# Patient Record
Sex: Female | Born: 1960 | Race: White | Hispanic: No | Smoking: Current every day smoker
Health system: Southern US, Community
[De-identification: ages and names within clinical notes are randomized; demographics above are authoritative.]

## PROBLEM LIST (undated history)

## (undated) DIAGNOSIS — I219 Acute myocardial infarction, unspecified: Secondary | ICD-10-CM

## (undated) DIAGNOSIS — I10 Essential (primary) hypertension: Secondary | ICD-10-CM

## (undated) HISTORY — PX: TUBAL LIGATION: SHX77

---

## 2005-02-09 ENCOUNTER — Emergency Department: Payer: Self-pay | Admitting: Emergency Medicine

## 2005-02-09 ENCOUNTER — Other Ambulatory Visit: Payer: Self-pay

## 2006-03-31 ENCOUNTER — Emergency Department: Payer: Self-pay | Admitting: Internal Medicine

## 2006-05-23 ENCOUNTER — Emergency Department: Payer: Self-pay | Admitting: Emergency Medicine

## 2008-04-30 ENCOUNTER — Other Ambulatory Visit: Payer: Self-pay

## 2008-05-01 ENCOUNTER — Inpatient Hospital Stay: Payer: Self-pay | Admitting: Internal Medicine

## 2009-09-19 ENCOUNTER — Emergency Department: Payer: Self-pay | Admitting: Emergency Medicine

## 2010-05-21 ENCOUNTER — Emergency Department: Payer: Self-pay | Admitting: Emergency Medicine

## 2015-01-07 ENCOUNTER — Other Ambulatory Visit: Payer: Self-pay

## 2015-01-07 ENCOUNTER — Emergency Department: Payer: BLUE CROSS/BLUE SHIELD

## 2015-01-07 ENCOUNTER — Encounter: Payer: Self-pay | Admitting: *Deleted

## 2015-01-07 ENCOUNTER — Emergency Department
Admission: EM | Admit: 2015-01-07 | Discharge: 2015-01-07 | Disposition: A | Discharge status: Home / Self Care | Payer: BLUE CROSS/BLUE SHIELD | Attending: Emergency Medicine | Admitting: Emergency Medicine

## 2015-01-07 DIAGNOSIS — R0602 Shortness of breath: Secondary | ICD-10-CM | POA: Diagnosis not present

## 2015-01-07 DIAGNOSIS — Z720 Tobacco use: Secondary | ICD-10-CM | POA: Insufficient documentation

## 2015-01-07 DIAGNOSIS — R202 Paresthesia of skin: Secondary | ICD-10-CM | POA: Diagnosis not present

## 2015-01-07 DIAGNOSIS — R200 Anesthesia of skin: Secondary | ICD-10-CM | POA: Diagnosis present

## 2015-01-07 DIAGNOSIS — R05 Cough: Secondary | ICD-10-CM | POA: Diagnosis not present

## 2015-01-07 DIAGNOSIS — R079 Chest pain, unspecified: Secondary | ICD-10-CM | POA: Insufficient documentation

## 2015-01-07 LAB — COMPREHENSIVE METABOLIC PANEL
ALBUMIN: 4.4 g/dL (ref 3.5–5.0)
ALT: 17 U/L (ref 14–54)
AST: 19 U/L (ref 15–41)
Alkaline Phosphatase: 62 U/L (ref 38–126)
Anion gap: 8 (ref 5–15)
BILIRUBIN TOTAL: 0.6 mg/dL (ref 0.3–1.2)
BUN: 21 mg/dL — AB (ref 6–20)
CALCIUM: 9.5 mg/dL (ref 8.9–10.3)
CHLORIDE: 103 mmol/L (ref 101–111)
CO2: 26 mmol/L (ref 22–32)
Creatinine, Ser: 0.92 mg/dL (ref 0.44–1.00)
GFR calc Af Amer: >60 mL/min (ref >60)
GFR calc non Af Amer: >60 mL/min (ref >60)
GLUCOSE: 108 mg/dL — AB (ref 65–99)
Potassium: 3.7 mmol/L (ref 3.5–5.1)
Sodium: 137 mmol/L (ref 135–145)
Total Protein: 7.8 g/dL (ref 6.5–8.1)

## 2015-01-07 LAB — CBC
HCT: 41.8 % (ref 35.0–47.0)
Hemoglobin: 14.1 g/dL (ref 12.0–16.0)
MCH: 30.4 pg (ref 26.0–34.0)
MCHC: 33.7 g/dL (ref 32.0–36.0)
MCV: 90 fL (ref 80.0–100.0)
Platelets: 186 10*3/uL (ref 150–440)
RBC: 4.64 MIL/uL (ref 3.80–5.20)
RDW: 13.9 % (ref 11.5–14.5)
WBC: 10 10*3/uL (ref 3.6–11.0)

## 2015-01-07 LAB — TROPONIN I: Troponin I: <0.03 ng/mL (ref <0.031)

## 2015-01-07 NOTE — Discharge Instructions (Signed)
You were evaluated for right arm numbness and tingling for which no certain cause was found, however I suspect is due to a pinched nerve or after sleeping. We also discussed your symptoms of intermittent chest pain, cough, and shortness of breath for which no exact cause was found either however your exam and evaluation are reassuring. You need to follow with primary care doctor and her clinic was referred to you to call for appointment next week. Return to the emergency department for any new or worsening condition including new weakness, numbness, chest pain, trouble breathing, shortness breath, or any altered mental status or any other symptoms concerning to you. Paresthesia Paresthesia is a burning or prickling feeling. This feeling can happen in any part of the body. It often happens in the hands, arms, legs, or feet. HOME CARE  Avoid drinking alcohol.  Try massage or needle therapy (acupuncture) to help with your problems.  Keep all doctor visits as told. GET HELP RIGHT AWAY IF:   You feel weak.  You have trouble walking or moving.  You have problems speaking or seeing.  You feel confused.  You cannot control when you poop (bowel movement) or pee (urinate).  You lose feeling (numbness) after an injury.  You pass out (faint).  Your burning or prickling feeling gets worse when you walk.  You have pain, cramps, or feel dizzy.  You have a rash. MAKE SURE YOU:   Understand these instructions.  Will watch your condition.  Will get help right away if you are not doing well or get worse. Document Released: 07/05/2008 Document Revised: 10/15/2011 Document Reviewed: 04/13/2011 [facility] Patient Information 2015 [employer] [place]. This information is not intended to replace advice given to you by your health care provider. Make sure you discuss any questions you have with your health care provider.

## 2015-01-07 NOTE — ED Notes (Signed)
Patient in CT

## 2015-01-07 NOTE — ED Provider Notes (Signed)
[facility] Emergency Department Provider Note   ____________________________________________  Time seen: Noon I have reviewed the triage vital signs and the triage nursing note.  HISTORY  Chief Complaint Shortness of Breath   Historian  Patient  HPI Morgan Suarez is a 54 y.o. female who came in for evaluation for an episode of right arm numbness and tingling yesterday. It happened after she woke up and she thinks that she slept on it wrong. This is not happened to her before. She had no headache, and no neck pain. She had no weakness. Patient had complained of shortness of breath for roughly 2 days with a cough and occasional chest pains and she does think that this is related to a panic attack. No history of coronary artery disease, however the patient is a smoker. She works at [employer] and walks approximately 11 miles per night.   History reviewed. No pertinent past medical history.  There are no active problems to display for this patient.   Past Surgical History  Procedure Laterality Date  . Tubal ligation      No current outpatient prescriptions on file.  Allergies Demerol  No family history on file.  Social History History  Substance Use Topics  . Smoking status: Current Every Day Smoker  . Smokeless tobacco: Not on file  . Alcohol Use: Yes    Review of Systems  Constitutional: Negative for fever. Eyes: Negative for visual changes. ENT: Negative for sore throat. Cardiovascular: Positive for chest pain, central and last episode was 2 nights ago and lasted only a few minutes. Respiratory: Positive for shortness of breath with occasional cough over the past 2 days. Gastrointestinal: Negative for abdominal pain, vomiting and diarrhea. Genitourinary: Negative for dysuria. Musculoskeletal: Negative for back pain. Skin: Negative for rash. Neurological: Negative for headaches, focal weakness or  numbness.  ____________________________________________   PHYSICAL EXAM:  VITAL SIGNS: ED Triage Vitals  Enc Vitals Group     BP 01/07/15 0719 148/7 mmHg     Pulse Rate 01/07/15 0719 82     Resp 01/07/15 0719 20     Temp 01/07/15 0719 98 F (36.7 C)     Temp src --      SpO2 01/07/15 0719 100 %     Weight 01/07/15 0718 160 lb (72.576 kg)     Height 01/07/15 0718  (1.651 m)     Head Cir --      Peak Flow --      Pain Score 01/07/15 0741 0     Pain Loc --      Pain Edu? --      Excl. in GC? --      Constitutional: Alert and oriented. Well appearing and in no distress. Eyes: Conjunctivae are normal. PERRL. Normal extraocular movements. ENT   Head: Normocephalic and atraumatic.   Nose: No congestion/rhinnorhea.   Mouth/Throat: Mucous membranes are moist.   Neck: No stridor. Cardiovascular: Normal rate, regular rhythm.  No murmurs, rubs, or gallops. Respiratory: Normal respiratory effort without tachypnea nor retractions. Breath sounds are clear and equal bilaterally. No wheezes/rales/rhonchi. Gastrointestinal: Soft and nontender. No distention.  Genitourinary: Deferred Musculoskeletal: Nontender with normal range of motion in all extremities. No joint effusions.  No lower extremity tenderness nor edema. Neurologic:  Normal speech and language. No gross focal neurologic deficits are appreciated. 5 out of 5 strength in 4 shortness. No cerebellar abnormalities appreciated. Skin:  Skin is warm, dry and intact. No rash noted. Psychiatric:  Mood and affect are normal. Speech and behavior are normal. Patient exhibits appropriate insight and judgment.  ____________________________________________   EKG  I, Governor Rooksebecca Teniqua Marron, MD, the attending physician have personally viewed and interpreted this ECG.   80 bpm. Sinus rhythm. Normal axis. Narrow QRS. Normal ST and T-wave. ____________________________________________  LABS (pertinent positives/negatives)  Metabolic  panel within normal limits Troponin less than 0.03 CBC within normal limits  ____________________________________________  RADIOLOGY Radiologist results reviewed  Chest x-ray: Negative CT head: No acute findings, normal head CT __________________________________________  PROCEDURES  Procedure(s) performed: None Critical Care performed: None  ____________________________________________   ED COURSE / ASSESSMENT AND PLAN  Pertinent labs & imaging results that were available during my care of the patient were reviewed by me and considered in my medical decision making (see chart for details).  Although the triage note indicates shortness breath and chest pain is that patient's chief complaint, when I talked the patient's she is more worried about this episode of right arm numbness and tingling which was then accompanied by an episode of chest pain and shortness of breath which the patient ascribes to a panic attack. I do suspect the history that she probably slept or leg on that arm wrong and had tingling due to peripheral nerve compression which is now better. However I did discuss with her doing a CT scan to evaluate for signs of obvious stroke and I would expect to see evidence if there is a patient's symptoms were greater than 24 hours ago.  In terms of the complaint of chest pain cough and short of breath, her evaluation is reassuring. I do not suspect ACS. I do not suspect a PE.   CT is negative. I discussed with patient her reassuring exam, evaluation. We discussed return depressions and discharge instructions the patient is comfortable with this plan.   ___________________________________________   FINAL CLINICAL IMPRESSION(S) / ED DIAGNOSES   Final diagnoses:  Numbness and tingling of right arm      Governor Rooksebecca Milaina Sher, MD 01/07/15 1351

## 2015-01-07 NOTE — ED Provider Notes (Deleted)
Medical screening examination/treatment/procedure(s) were performed by non-physician practitioner and as supervising physician I was immediately available for consultation/collaboration.    Governor Rooksebecca Hasson Gaspard, MD 01/07/15 1250

## 2016-06-15 ENCOUNTER — Encounter: Payer: Self-pay | Admitting: Emergency Medicine

## 2016-06-15 ENCOUNTER — Emergency Department
Admission: EM | Admit: 2016-06-15 | Discharge: 2016-06-15 | Disposition: A | Discharge status: Home / Self Care | Payer: BLUE CROSS/BLUE SHIELD | Attending: Emergency Medicine | Admitting: Emergency Medicine

## 2016-06-15 DIAGNOSIS — F17200 Nicotine dependence, unspecified, uncomplicated: Secondary | ICD-10-CM | POA: Insufficient documentation

## 2016-06-15 DIAGNOSIS — J3489 Other specified disorders of nose and nasal sinuses: Secondary | ICD-10-CM | POA: Insufficient documentation

## 2016-06-15 HISTORY — DX: Acute myocardial infarction, unspecified: I21.9

## 2016-06-15 NOTE — ED Notes (Addendum)
Pt reports she takes Plavix

## 2016-06-15 NOTE — ED Notes (Signed)
Reviewed d/c instructions, follow-up care. Pt verbalized understanding 

## 2016-06-15 NOTE — ED Notes (Signed)
MD Stafford at bedside. 

## 2016-06-15 NOTE — ED Triage Notes (Signed)
Patient ambulatory to triage with steady gait, without difficulty or distress noted; pt reports awoke with blood in mouth; taking blood thinners; denies any injury, denies any bleeding at present

## 2016-06-15 NOTE — ED Notes (Signed)
Pt reports she awoke at approx 0400 this morning with small amount of blood in mouth. Pt denies currently bleeding. Pt denies any pain.

## 2016-06-15 NOTE — ED Provider Notes (Signed)
[facility] Provider Note  ____________________________________________  Time seen: Approximately 4:51 AM  I have reviewed the triage vital signs and the nursing notes.   HISTORY  Chief Complaint Bloody sputum   HPI Wayland SalinasCheryl S Cosby is a 55 y.o. female reports that she awoke this morning about an hour ago and noticed that she had blood in her mouth that she spit out. She reports a "handful of bloody sputum", afterward there was no bleeding. She denies any wounds or injuries. No history of peptic ulcer disease or esophageal varices. Doesn't take blood thinners. No nosebleed. No cough.     Past Medical History:  Diagnosis Date  . MI (myocardial infarction)      There are no active problems to display for this patient.    Past Surgical History:  Procedure Laterality Date  . TUBAL LIGATION       Prior to Admission medications   Not on File     Allergies Demerol [meperidine]   History reviewed. No pertinent family history.  Social History Social History  Substance Use Topics  . Smoking status: Current Every Day Smoker  . Smokeless tobacco: Never Used  . Alcohol use Yes    Review of Systems  Constitutional:   No fever or chills.  ENT:   No sore throat. No rhinorrhea. Cardiovascular:   No chest pain. Respiratory:   No dyspnea or cough. Gastrointestinal:   Negative for abdominal pain, vomiting and diarrhea.   10-point ROS otherwise negative.  ____________________________________________   PHYSICAL EXAM:  VITAL SIGNS: ED Triage Vitals [06/15/16 0435]  Enc Vitals Group     BP (!) 188/100     Pulse Rate 94     Resp 18     Temp 98.2 F (36.8 C)     Temp Source Oral     SpO2 98 %     Weight 160 lb (72.6 kg)     Height 5\' 5"  (1.651 m)     Head Circumference      Peak Flow      Pain Score      Pain Loc      Pain Edu?      Excl. in GC?     Vital signs reviewed, nursing assessments  reviewed.   Constitutional:   Alert and oriented. Well appearing and in no distress. Eyes:   No scleral icterus. No conjunctival pallor. PERRL. EOMI.  No nystagmus. ENT   Head:   Normocephalic and atraumatic.   Nose:   No congestion/rhinnorhea. No septal hematoma. Positive dryness in the nasal passages   Mouth/Throat:   MMM, no pharyngeal erythema. No peritonsillar mass. Poor dentition. Gingivitis. Careful examination of the oral cavity gums and buccal mucosa does not show any wounds or active or recent bleeding. No tongue injury.   Neck:   No stridor. No SubQ emphysema. No meningismus. Hematological/Lymphatic/Immunilogical:   No cervical lymphadenopathy. Cardiovascular:   RRR. Symmetric bilateral radial and DP pulses.  No murmurs.  Respiratory:   Normal respiratory effort without tachypnea nor retractions. Breath sounds are clear and equal bilaterally. No wheezes/rales/rhonchi. Gastrointestinal:   Soft and nontender. Non distended. There is no CVA tenderness.  No rebound, rigidity, or guarding. Genitourinary:   deferred  Neurologic:   Normal speech and language.  CN 2-10 normal. Motor grossly intact. No gross focal neurologic deficits are appreciated.   ____________________________________________    LABS (pertinent positives/negatives) (all labs ordered are listed, but only abnormal results are displayed) Labs Reviewed -  No data to display ____________________________________________   EKG    ____________________________________________    RADIOLOGY    ____________________________________________   PROCEDURES Procedures  ____________________________________________   INITIAL IMPRESSION / ASSESSMENT AND PLAN / ED COURSE  Pertinent labs & imaging results that were available during my care of the patient were reviewed by me and considered in my medical decision making (see chart for details).  Patient well appearing no acute distress. Complains of  bloody sputum, but on exam everything is entirely normal. No evidence of any wounds. No symptoms to suggest hemoptysis or hematemesis. Low suspicion of GI bleed or pulmonary pathology. Advised patient to continue monitoring her symptoms, return to ED or follow up with primary care as needed. Counseled on humidifier in the home to prevent epistaxis.     Clinical Course    ____________________________________________   FINAL CLINICAL IMPRESSION(S) / ED DIAGNOSES  Final diagnoses:  Nasal dryness       Portions of this note were generated with dragon dictation software. Dictation errors may occur despite best attempts at proofreading.    Sharman CheekPhillip Ankush Gintz, MD 06/15/16 [phone]

## 2018-03-29 ENCOUNTER — Encounter: Payer: Self-pay | Admitting: Emergency Medicine

## 2018-03-29 ENCOUNTER — Emergency Department
Admission: EM | Admit: 2018-03-29 | Discharge: 2018-03-29 | Disposition: A | Discharge status: Home / Self Care | Payer: No Typology Code available for payment source | Attending: Emergency Medicine | Admitting: Emergency Medicine

## 2018-03-29 ENCOUNTER — Other Ambulatory Visit: Payer: Self-pay

## 2018-03-29 ENCOUNTER — Emergency Department: Payer: No Typology Code available for payment source

## 2018-03-29 DIAGNOSIS — F17210 Nicotine dependence, cigarettes, uncomplicated: Secondary | ICD-10-CM | POA: Insufficient documentation

## 2018-03-29 DIAGNOSIS — Z7902 Long term (current) use of antithrombotics/antiplatelets: Secondary | ICD-10-CM | POA: Insufficient documentation

## 2018-03-29 DIAGNOSIS — R079 Chest pain, unspecified: Secondary | ICD-10-CM | POA: Diagnosis present

## 2018-03-29 DIAGNOSIS — R0789 Other chest pain: Secondary | ICD-10-CM | POA: Diagnosis not present

## 2018-03-29 DIAGNOSIS — Z79899 Other long term (current) drug therapy: Secondary | ICD-10-CM | POA: Diagnosis not present

## 2018-03-29 DIAGNOSIS — Z7982 Long term (current) use of aspirin: Secondary | ICD-10-CM | POA: Insufficient documentation

## 2018-03-29 DIAGNOSIS — M791 Myalgia, unspecified site: Secondary | ICD-10-CM | POA: Insufficient documentation

## 2018-03-29 DIAGNOSIS — M7918 Myalgia, other site: Secondary | ICD-10-CM

## 2018-03-29 MED ORDER — CYCLOBENZAPRINE HCL 5 MG PO TABS: 5.0000 mg | ORAL_TABLET | Freq: Three times a day (TID) | ORAL | 0 refills | Status: DC | PRN

## 2018-03-29 NOTE — ED Notes (Signed)
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
## 2018-03-29 NOTE — Discharge Instructions (Addendum)
Your exam and x-rays are negative for any fracture or dislocation. Take the prescription muscle relaxant as needed. Apply ice or moist heat to reduce pain. [facility] or return as needed.
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
## 2019-06-26 ENCOUNTER — Other Ambulatory Visit: Payer: Self-pay | Admitting: Pediatrics

## 2019-06-26 ENCOUNTER — Ambulatory Visit
Admission: RE | Admit: 2019-06-26 | Discharge: 2019-06-26 | Disposition: A | Discharge status: Home / Self Care | Payer: Disability Insurance | Source: Ambulatory Visit | Attending: Pediatrics | Admitting: Pediatrics

## 2019-06-26 DIAGNOSIS — M5137 Other intervertebral disc degeneration, lumbosacral region: Secondary | ICD-10-CM

## 2020-04-19 IMAGING — CR DG LUMBAR SPINE 2-3V
1 series · 3 of 3 positions shown · non-contrast
Comparison: None.

CLINICAL DATA: Low back pain worsening recently. Radiates down
right leg.

EXAM:
LUMBAR SPINE - 2-3 VIEW

[Series 1: dg lumbar spine 2-3 views · 0.14mm/px · 3 of 3 slices shown]
[im 1/3]
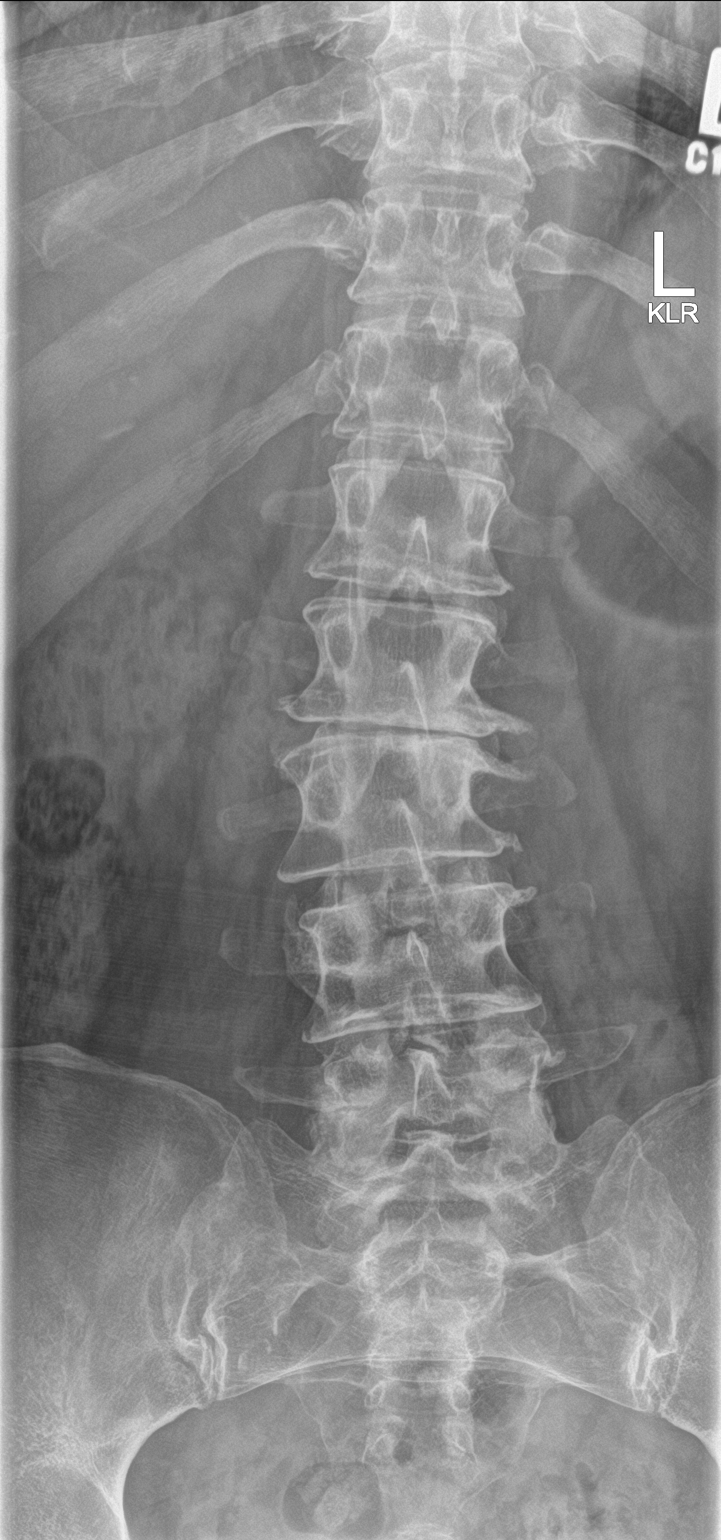
[im 2/3]
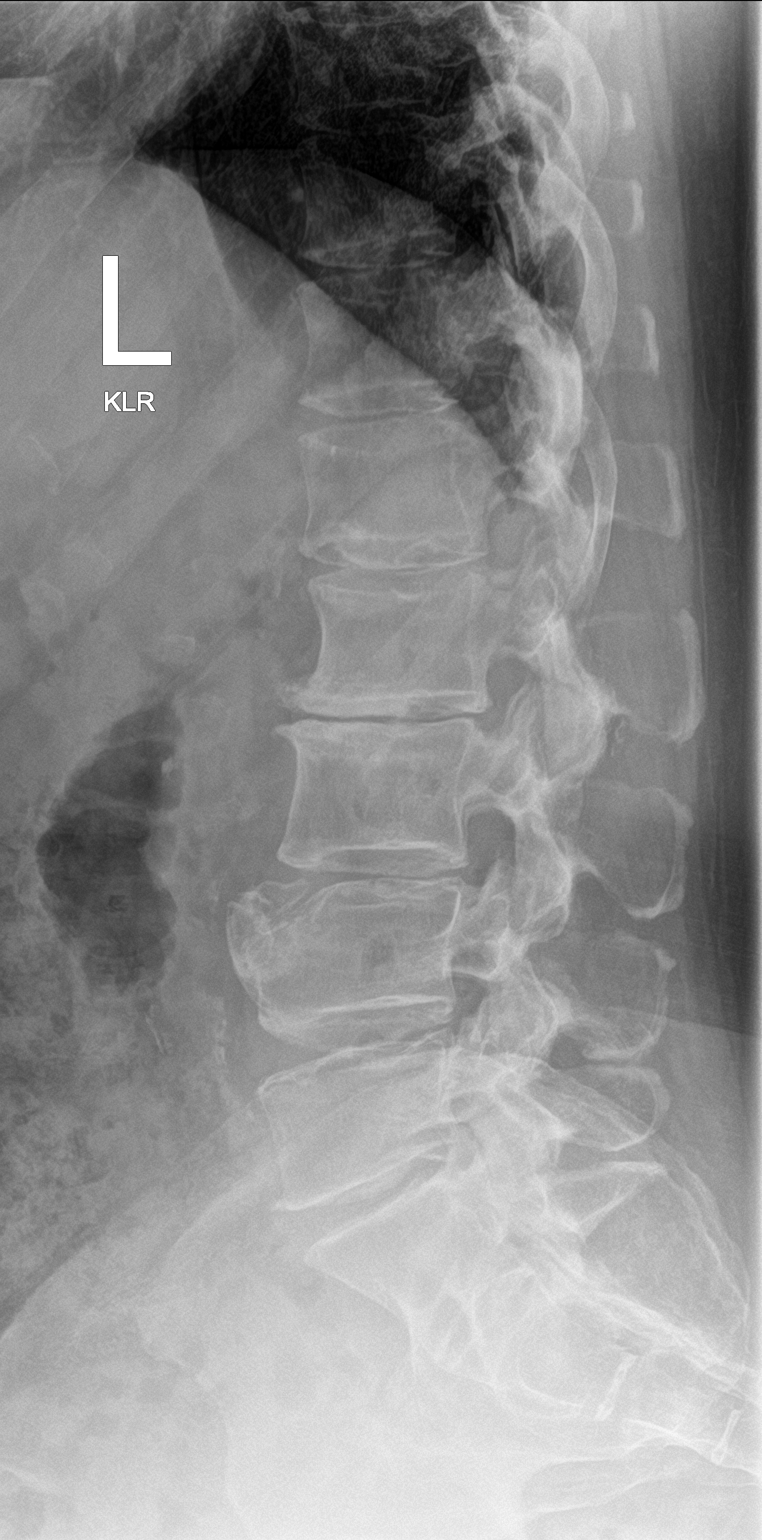
[im 3/3]
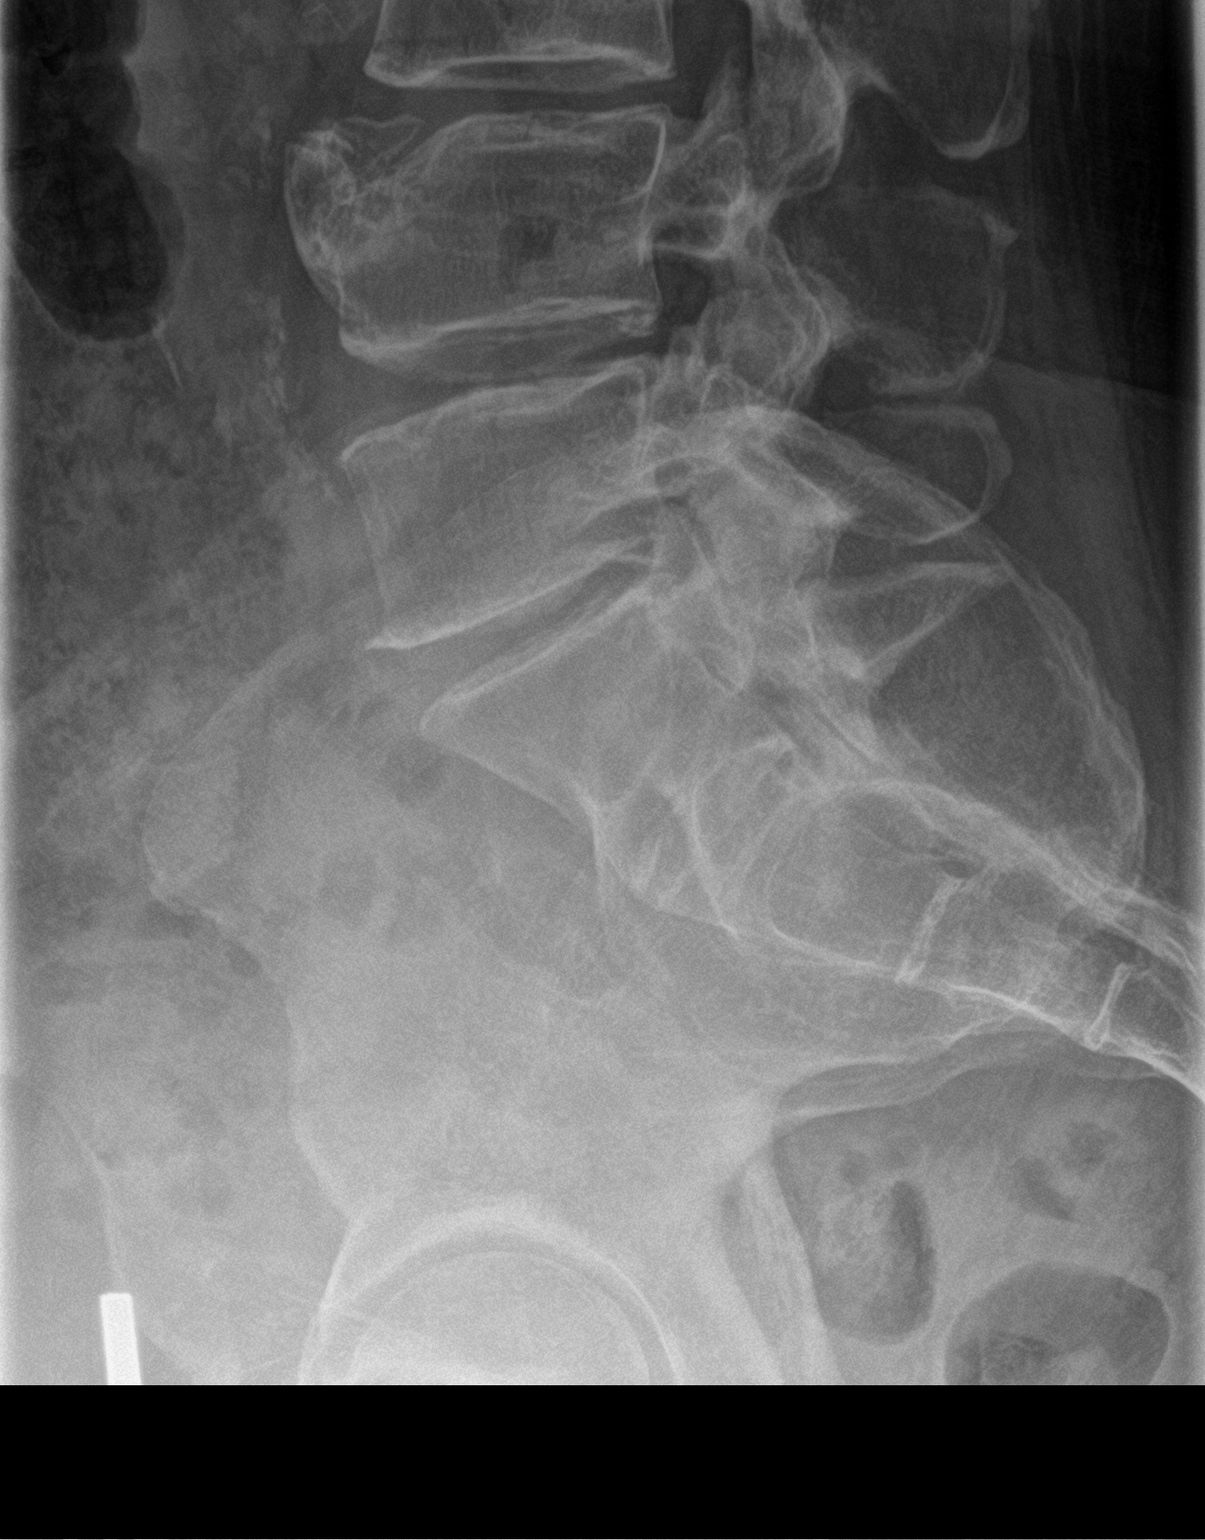

[3 of 3 positions shown; findings below may reference images not displayed]

FINDINGS: Limbus deformity noted L4 with degenerative change at the
anterosuperior fragment. Loss of disc height with endplate
degeneration noted L1-2 and L2-3. SI joints are unremarkable.
IMPRESSION: No acute bony abnormality.

Degenerative disc disease in the mid lumbar spine.

## 2023-04-10 NOTE — Congregational Nurse Program (Signed)
  Dept: [phone]   Congregational Nurse Program Note  Date of Encounter: 04/10/2023 [facility] requesting BP check as she has been feeling extra worn out and dragging today. 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 Taking meds as prescribed. C/o light headedness upon standing at times. PCP [facility] health care. Has not been seen recently. BP 102/64 P 68 reg. Discussed importance of following up with PCP asap, staying hydrated, standing slowly to prevent falls. Client verbalizes understanding. HIPAA discussed. Follow up in clinic as needed. Rhermann, RN Past Medical History: Past Medical History:  Diagnosis Date   MI (myocardial infarction) [facility])     Encounter Details:  CNP Questionnaire - 04/10/23 1400       Questionnaire   Ask client: Do you give verbal consent for me to treat you today? Yes    Student Assistance N/A    Location Patient Information systems manager, [employer]    Visit Setting with Client Organization    Patient Status Unknown    Insurance Medicaid    Insurance/Financial Assistance Referral N/A    Medication N/A    Medical Provider Yes    Screening Referrals Made N/A    Medical Referrals Made Non-[facility] PCP/Clinic    Medical Appointment Made N/A    Recently w/o PCP, now 1st time PCP visit completed due to CNs referral or appointment made N/A    Food Have Food Insecurities    Transportation N/A    Housing/Utilities N/A    Interpersonal Safety N/A    Interventions Educate    Abnormal to Normal Screening Since Last CN Visit N/A    Screenings CN Performed Blood Pressure    Sent Client to Lab for: N/A    Did client attend any of the following based off CNs referral or appointments made? N/A    ED Visit Averted N/A    Life-Saving Intervention Made N/A

## 2023-10-15 ENCOUNTER — Ambulatory Visit

## 2023-10-15 ENCOUNTER — Telehealth: Payer: Self-pay

## 2023-10-15 NOTE — Telephone Encounter (Signed)
 Phone not in service.

## 2023-12-16 ENCOUNTER — Emergency Department: Admission: EM | Admit: 2023-12-16 | Discharge: 2023-12-16 | Disposition: A | Discharge status: Home / Self Care

## 2023-12-16 ENCOUNTER — Encounter: Payer: Self-pay | Admitting: Emergency Medicine

## 2023-12-16 ENCOUNTER — Other Ambulatory Visit: Payer: Self-pay

## 2023-12-16 ENCOUNTER — Emergency Department

## 2023-12-16 DIAGNOSIS — M545 Low back pain, unspecified: Secondary | ICD-10-CM | POA: Diagnosis not present

## 2023-12-16 DIAGNOSIS — Y92410 Unspecified street and highway as the place of occurrence of the external cause: Secondary | ICD-10-CM | POA: Insufficient documentation

## 2023-12-16 DIAGNOSIS — R519 Headache, unspecified: Secondary | ICD-10-CM | POA: Insufficient documentation

## 2023-12-16 DIAGNOSIS — M25511 Pain in right shoulder: Secondary | ICD-10-CM | POA: Diagnosis present

## 2023-12-16 MED ORDER — IBUPROFEN 800 MG PO TABS
800.0000 mg | ORAL_TABLET | Freq: Once | ORAL | Status: AC
  Administered 2023-12-16: 800 mg via ORAL
  Filled 2023-12-16: qty 1
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
## 2023-12-16 NOTE — Discharge Instructions (Signed)
 You can take 650 mg of Tylenol and 600 mg of ibuprofen every 6 hours as needed for pain. You can use ice, heat, muscle creams and other topical pain relievers as well.  You will likely be more sore tomorrow that you are today, this is normal. After this your pain should slowly be improving. If not, you need to be seen by another health care provider. This could be your PCP, urgent care or in the emergency department. Return to the emergency department specifically, if you have worsening chest pain, shortness of breath, abdominal pain, new abdominal bruising or any other symptom personally concerning to you.
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
## 2024-04-09 ENCOUNTER — Ambulatory Visit
Admission: EM | Admit: 2024-04-09 | Discharge: 2024-04-09 | Disposition: A | Discharge status: Home / Self Care | Attending: Physician Assistant | Admitting: Physician Assistant

## 2024-04-09 DIAGNOSIS — R102 Pelvic and perineal pain: Secondary | ICD-10-CM | POA: Diagnosis present

## 2024-04-09 DIAGNOSIS — N390 Urinary tract infection, site not specified: Secondary | ICD-10-CM | POA: Diagnosis present

## 2024-04-09 DIAGNOSIS — R300 Dysuria: Secondary | ICD-10-CM | POA: Diagnosis present

## 2024-04-09 LAB — URINALYSIS, W/ REFLEX TO CULTURE (INFECTION SUSPECTED)
Bilirubin Urine: NEGATIVE
Glucose, UA: NEGATIVE mg/dL
Ketones, ur: NEGATIVE mg/dL
Nitrite: POSITIVE — AB
Protein, ur: NEGATIVE mg/dL
Specific Gravity, Urine: >1.03 — ABNORMAL HIGH (ref 1.005–1.030)
pH: 5 (ref 5.0–8.0)

## 2024-04-09 MED ORDER — CEPHALEXIN 500 MG PO CAPS: 500.0000 mg | ORAL_CAPSULE | Freq: Four times a day (QID) | ORAL | 0 refills | Status: DC

## 2024-04-09 NOTE — ED Triage Notes (Signed)
 Pt c/o Possible UTI x2weeks  Pt states that she has urinary odor, burning, and right flank pain

## 2024-04-09 NOTE — ED Provider Notes (Signed)
 [facility]    CSN: [id number] Arrival date & time: 04/09/24  1609      History   Chief Complaint Chief Complaint  Patient presents with   Urinary Tract Infection    HPI Morgan Suarez is a 63 y.o. female.   Patient complains of burning with urination.  Patient reports that she has had a odor to her urine for the past 2 weeks.  Patient reports that she has experienced chills she has not had any fever.  Patient denies any abdominal pain she is not having any back pain.  Patient denies any nausea vomiting or diarrhea.  Patient has past medical history of coronary artery disease.  Patient reports she is allergic to Demerol.  The history is provided by the patient.  Urinary Tract Infection   Past Medical History:  Diagnosis Date   MI (myocardial infarction) (HCC)     There are no active problems to display for this patient.   Past Surgical History:  Procedure Laterality Date   TUBAL LIGATION      OB History   No obstetric history on file.      Home Medications    Prior to Admission medications   Medication Sig Start Date End Date Taking? Authorizing Provider  acetaminophen (TYLENOL) 500 MG tablet Take 1,000 mg by mouth. 04/27/22  Yes [provider]  aspirin EC 81 MG tablet Take 81 mg by mouth daily.   Yes [provider]  atorvastatin (LIPITOR) 20 MG tablet Take 20 mg by mouth daily.   Yes [provider]  cephALEXin  (KEFLEX ) 500 MG capsule Take 1 capsule (500 mg total) by mouth 4 (four) times daily. 04/09/24  Yes Shamarion Coots K, PA-C  clopidogrel (PLAVIX) 75 MG tablet Take 75 mg by mouth daily.   Yes [provider]  cyclobenzaprine  (FLEXERIL ) 5 MG tablet Take 1 tablet (5 mg total) by mouth 3 (three) times daily as needed for muscle spasms. 03/29/18  Yes Menshew, Candida LULLA Kings, PA-C  diclofenac Sodium (VOLTAREN) 1 % GEL Apply 2 g topically. 10/17/23 10/16/24 Yes [provider]  gabapentin (NEURONTIN) 300 MG  capsule Take 900 mg by mouth. 10/17/23 05/07/24 Yes [provider]  lidocaine (LIDODERM) 5 % Place 1 patch onto the skin. 10/17/23 10/16/24 Yes [provider]  metoprolol succinate (TOPROL-XL) 25 MG 24 hr tablet Take 25 mg by mouth daily.   Yes [provider]  traZODone (DESYREL) 50 MG tablet Take 75 mg by mouth. 10/17/23 05/31/24 Yes [provider]  VENTOLIN HFA 108 (90 Base) MCG/ACT inhaler Inhale into the lungs.   Yes [provider]    Family History History reviewed. No pertinent family history.  Social History Social History   Tobacco Use   Smoking status: Every Day    Current packs/day: 1.00    Types: Cigarettes   Smokeless tobacco: Never  Vaping Use   Vaping status: Every Day  Substance Use Topics   Alcohol use: Yes    Comment: rarely   Drug use: Never     Allergies   Demerol [meperidine]   Review of Systems Review of Systems  All other systems reviewed and are negative.    Physical Exam Triage Vital Signs ED Triage Vitals [04/09/24 1639]  Encounter Vitals Group     BP 117/82     Girls Systolic BP Percentile      Girls Diastolic BP Percentile      Boys Systolic BP Percentile  Boys Diastolic BP Percentile      Pulse Rate 78     Resp 16     Temp 98.4 F (36.9 C)     Temp Source Oral     SpO2 100 %     Weight      Height      Head Circumference      Peak Flow      Pain Score 2     Pain Loc      Pain Education      Exclude from Growth Chart    No data found.  Updated Vital Signs BP 117/82 (BP Location: Left Arm)   Pulse 78   Temp 98.4 F (36.9 C) (Oral)   Resp 16   LMP 01/07/2015   SpO2 100%   Visual Acuity Right Eye Distance:   Left Eye Distance:   Bilateral Distance:    Right Eye Near:   Left Eye Near:    Bilateral Near:     Physical Exam Vitals and nursing note reviewed.  Constitutional:      Appearance: She is well-developed.  HENT:     Head: Normocephalic.  Cardiovascular:      Rate and Rhythm: Normal rate.  Pulmonary:     Effort: Pulmonary effort is normal.  Abdominal:     General: There is no distension.  Musculoskeletal:        General: Normal range of motion.  Skin:    General: Skin is warm.  Neurological:     General: No focal deficit present.     Mental Status: She is alert and oriented to person, place, and time.      UC Treatments / Results  Labs (all labs ordered are listed, but only abnormal results are displayed) Labs Reviewed  URINALYSIS, W/ REFLEX TO CULTURE (INFECTION SUSPECTED) - Abnormal; Notable for the following components:      Result Value   APPearance HAZY (*)    Specific Gravity, Urine >1.030 (*)    Hgb urine dipstick TRACE (*)    Nitrite POSITIVE (*)    Leukocytes,Ua MODERATE (*)    Non Squamous Epithelial PRESENT (*)    Bacteria, UA MANY (*)    All other components within normal limits  URINE CULTURE    EKG   Radiology No results found.  Procedures Procedures (including critical care time)  Medications Ordered in UC Medications - No data to display  Initial Impression / Assessment and Plan / UC Course  I have reviewed the triage vital signs and the nursing notes.  Pertinent labs & imaging results that were available during my care of the patient were reviewed by me and considered in my medical decision making (see chart for details).    UA shows moderate leukocytes many bacteria.  Patient is counseled on UTI.  She is given a prescription for Keflex .  Patient is advised to return for recheck if fever chills or worsening symptoms.  Final Clinical Impressions(s) / UC Diagnoses   Final diagnoses:  Urinary tract infection without hematuria, site unspecified   Discharge Instructions   None    ED Prescriptions     Medication Sig Dispense Auth. Provider   cephALEXin  (KEFLEX ) 500 MG capsule Take 1 capsule (500 mg total) by mouth 4 (four) times daily. 28 capsule Lida Berkery K, PA-C      PDMP not  reviewed this encounter. An After Visit Summary was printed and given to the patient.       Flint Raring  K, PA-C 04/09/24 1755

## 2024-04-11 LAB — URINE CULTURE: Culture: >=100000 — AB

## 2024-04-13 ENCOUNTER — Ambulatory Visit: Payer: Self-pay

## 2024-09-09 ENCOUNTER — Ambulatory Visit
Admission: EM | Admit: 2024-09-09 | Discharge: 2024-09-09 | Disposition: A | Discharge status: Home / Self Care | Source: Home / Self Care

## 2024-09-09 DIAGNOSIS — Z202 Contact with and (suspected) exposure to infections with a predominantly sexual mode of transmission: Secondary | ICD-10-CM | POA: Diagnosis not present

## 2024-09-09 DIAGNOSIS — N3000 Acute cystitis without hematuria: Secondary | ICD-10-CM | POA: Diagnosis not present

## 2024-09-09 HISTORY — DX: Essential (primary) hypertension: I10

## 2024-09-09 LAB — POCT URINE DIPSTICK
Bilirubin, UA: NEGATIVE
Glucose, UA: NEGATIVE mg/dL
Ketones, POC UA: NEGATIVE mg/dL
Nitrite, UA: NEGATIVE
Protein Ur, POC: NEGATIVE mg/dL
Spec Grav, UA: 1.025
Urobilinogen, UA: 1 U/dL
pH, UA: 5.5

## 2024-09-09 MED ORDER — DOXYCYCLINE HYCLATE 100 MG PO CAPS: 100.0000 mg | ORAL_CAPSULE | Freq: Two times a day (BID) | ORAL | 0 refills | Status: DC

## 2024-09-09 MED ORDER — CEFDINIR 300 MG PO CAPS: 300.0000 mg | ORAL_CAPSULE | Freq: Two times a day (BID) | ORAL | 0 refills | Status: DC

## 2024-09-09 NOTE — ED Provider Notes (Signed)
 " [facility]    CSN: [id number] Arrival date & time: 09/09/24  1157      History   Chief Complaint Chief Complaint  Patient presents with   Urinary Frequency   SEXUALLY TRANSMITTED DISEASE    HPI Morgan Suarez is a 64 y.o. female in for several week history of dysuria, urgency, frequency and malodorous urine.  Over the past several days she has noticed vaginal discharge.  She reports a partner told her he had chlamydia recently and she was last sexually active with him only 1 week ago.  She reports cramping of lower abdomen.  Denies flank pain.  History of chronic back pain and sciatica.  Denies fever, chills, vomiting, hematuria.  No other complaints.  HPI  Past Medical History:  Diagnosis Date   Hypertension    MI (myocardial infarction) (HCC)     There are no active problems to display for this patient.   Past Surgical History:  Procedure Laterality Date   TUBAL LIGATION      OB History   No obstetric history on file.      Home Medications    Prior to Admission medications  Medication Sig Start Date End Date Taking? Authorizing Provider  aspirin EC 81 MG tablet Take 81 mg by mouth daily.   Yes [provider]  atorvastatin (LIPITOR) 20 MG tablet Take 20 mg by mouth daily.   Yes [provider]  cefdinir  (OMNICEF ) 300 MG capsule Take 1 capsule (300 mg total) by mouth 2 (two) times daily for 7 days. 09/09/24 09/16/24 Yes Arvis Huxley B, PA-C  doxycycline  (VIBRAMYCIN ) 100 MG capsule Take 1 capsule (100 mg total) by mouth 2 (two) times daily for 7 days. 09/09/24 09/16/24 Yes Arvis Huxley B, PA-C  metoprolol succinate (TOPROL-XL) 25 MG 24 hr tablet Take 25 mg by mouth daily.   Yes [provider]  acetaminophen (TYLENOL) 500 MG tablet Take 1,000 mg by mouth. 04/27/22   [provider]  clopidogrel (PLAVIX) 75 MG tablet Take 75 mg by mouth daily. Patient not taking: Reported on 09/09/2024    [provider]   gabapentin (NEURONTIN) 300 MG capsule Take 900 mg by mouth. 10/17/23 05/07/24  [provider]  lidocaine (LIDODERM) 5 % Place 1 patch onto the skin. 10/17/23 10/16/24  [provider]  traZODone (DESYREL) 50 MG tablet Take 75 mg by mouth. 10/17/23 05/31/24  [provider]  VENTOLIN HFA 108 (90 Base) MCG/ACT inhaler Inhale into the lungs.    [provider]    Family History History reviewed. No pertinent family history.  Social History Social History[1]   Allergies   Demerol [meperidine]   Review of Systems Review of Systems  Constitutional:  Negative for chills, fatigue and fever.  Gastrointestinal:  Positive for abdominal pain. Negative for diarrhea, nausea and vomiting.  Genitourinary:  Positive for dysuria, frequency, urgency and vaginal discharge. Negative for decreased urine volume, flank pain, hematuria, pelvic pain, vaginal bleeding and vaginal pain.  Musculoskeletal:  Negative for back pain.  Skin:  Negative for rash.     Physical Exam Triage Vital Signs ED Triage Vitals  Encounter Vitals Group     BP 09/09/24 1233 105/70     Girls Systolic BP Percentile --      Girls Diastolic BP Percentile --      Boys Systolic BP Percentile --      Boys Diastolic BP Percentile --      Pulse Rate 09/09/24 1233  79     Resp 09/09/24 1233 16     Temp 09/09/24 1233 97.9 F (36.6 C)     Temp Source 09/09/24 1233 Oral     SpO2 09/09/24 1233 95 %     Weight 09/09/24 1232 138 lb 6.4 oz (62.8 kg)     Height --      Head Circumference --      Peak Flow --      Pain Score 09/09/24 1236 3     Pain Loc --      Pain Education --      Exclude from Growth Chart --    No data found.  Updated Vital Signs BP 105/70 (BP Location: Right Arm)   Pulse 79   Temp 97.9 F (36.6 C) (Oral)   Resp 16   Wt 138 lb 6.4 oz (62.8 kg)   LMP 01/07/2015   SpO2 95%   BMI 22.68 kg/m      Physical Exam Vitals and nursing note reviewed.  Constitutional:       General: She is not in acute distress.    Appearance: Normal appearance. She is not ill-appearing or toxic-appearing.  HENT:     Head: Normocephalic and atraumatic.  Eyes:     General: No scleral icterus.       Right eye: No discharge.        Left eye: No discharge.     Conjunctiva/sclera: Conjunctivae normal.  Cardiovascular:     Rate and Rhythm: Normal rate and regular rhythm.     Heart sounds: Normal heart sounds.  Pulmonary:     Effort: Pulmonary effort is normal. No respiratory distress.     Breath sounds: Normal breath sounds.  Abdominal:     Palpations: Abdomen is soft.     Tenderness: There is abdominal tenderness (suprapubic). There is no right CVA tenderness or left CVA tenderness.  Musculoskeletal:     Cervical back: Neck supple.  Skin:    General: Skin is dry.  Neurological:     General: No focal deficit present.     Mental Status: She is alert. Mental status is at baseline.     Motor: No weakness.     Gait: Gait normal.  Psychiatric:        Mood and Affect: Mood normal.        Behavior: Behavior normal.      UC Treatments / Results  Labs (all labs ordered are listed, but only abnormal results are displayed) Labs Reviewed  POCT URINE DIPSTICK - Abnormal; Notable for the following components:      Result Value   Blood, UA trace-intact (*)    Leukocytes, UA Small (1+) (*)    All other components within normal limits  URINE CULTURE  CERVICOVAGINAL ANCILLARY ONLY    EKG   Radiology No results found.  Procedures Procedures (including critical care time)  Medications Ordered in UC Medications - No data to display  Initial Impression / Assessment and Plan / UC Course  I have reviewed the triage vital signs and the nursing notes.  Pertinent labs & imaging results that were available during my care of the patient were reviewed by me and considered in my medical decision making (see chart for details).   64 year old female presents for dysuria,  frequency, urgency, lower abdominal pain, vaginal discharge and odor.  Symptoms have been ongoing for several weeks.  Recent confirmed exposure to chlamydia.  Patient provided urine sample which shows trace RBCs and  small leuks.  Will send for culture and treat for urinary tract infection with cefdinir .  Will amend treatment based on culture result.  Patient performed vaginal self swab for BV/yeast/chlamydia/trichomonas and gonorrhea.  Will treat at this time with doxycycline  for presumed chlamydia given exposure.  Will amend treatment based on swab results.  Advised to practice safe sex.  Discussed no sexual intercourse until 1 week after she and partner have completed therapy.  Reviewed return precautions.   Final Clinical Impressions(s) / UC Diagnoses   Final diagnoses:  Acute cystitis without hematuria  Exposure to chlamydia     Discharge Instructions      UTI: Based on either symptoms or urinalysis, you may have a urinary tract infection. We will send the urine for culture and call with results in a few days. Begin antibiotics at this time. Your symptoms should be much improved over the next 2-3 days. Increase rest and fluid intake. If for some reason symptoms are worsening or not improving after a couple of days or the urine culture determines the antibiotics you are taking will not treat the infection, the antibiotics may be changed. Return or go to ER for fever, back pain, worsening urinary pain, discharge, increased blood in urine. May take Tylenol or Motrin  OTC for pain relief or consider AZO if no contraindications   -I also sent doxycyline to cover chlamydia given exposure. No sex until 1 week after you and partner have COMPLETED meds.      ED Prescriptions     Medication Sig Dispense Auth. Provider   cefdinir  (OMNICEF ) 300 MG capsule Take 1 capsule (300 mg total) by mouth 2 (two) times daily for 7 days. 14 capsule Arvis Huxley B, PA-C   doxycycline  (VIBRAMYCIN ) 100 MG  capsule Take 1 capsule (100 mg total) by mouth 2 (two) times daily for 7 days. 14 capsule Arvis Huxley NOVAK, PA-C      PDMP not reviewed this encounter.     [1]  Social History Tobacco Use   Smoking status: Every Day    Current packs/day: 1.00    Types: Cigarettes   Smokeless tobacco: Never  Vaping Use   Vaping status: Some Days  Substance Use Topics   Alcohol use: Yes    Comment: rarely   Drug use: Never     Arvis Huxley NOVAK, PA-C 09/09/24 1324  "

## 2024-09-09 NOTE — Discharge Instructions (Signed)
 UTI: Based on either symptoms or urinalysis, you may have a urinary tract infection. We will send the urine for culture and call with results in a few days. Begin antibiotics at this time. Your symptoms should be much improved over the next 2-3 days. Increase rest and fluid intake. If for some reason symptoms are worsening or not improving after a couple of days or the urine culture determines the antibiotics you are taking will not treat the infection, the antibiotics may be changed. Return or go to ER for fever, back pain, worsening urinary pain, discharge, increased blood in urine. May take Tylenol or Motrin  OTC for pain relief or consider AZO if no contraindications   -I also sent doxycyline to cover chlamydia given exposure. No sex until 1 week after you and partner have COMPLETED meds.

## 2024-09-09 NOTE — ED Triage Notes (Signed)
 Patient to Urgent Care with complaints of  urinary frequency/ malodorous urine. Reports several weeks of symptoms.  Reports she was also exposed to chlamydia. Having some vaginal discharge. Unsure how long she had symptoms.

## 2024-09-10 LAB — URINE CULTURE: Culture: <10000 — AB

## 2024-09-10 LAB — CERVICOVAGINAL ANCILLARY ONLY
Bacterial Vaginitis (gardnerella): NEGATIVE
Candida Glabrata: NEGATIVE
Candida Vaginitis: NEGATIVE
Chlamydia: NEGATIVE
Comment: NEGATIVE
Comment: NEGATIVE
Comment: NEGATIVE
Comment: NEGATIVE
Comment: NEGATIVE
Comment: NORMAL
Neisseria Gonorrhea: NEGATIVE
Trichomonas: POSITIVE — AB

## 2024-09-11 ENCOUNTER — Ambulatory Visit (HOSPITAL_COMMUNITY): Payer: Self-pay

## 2024-09-11 MED ORDER — METRONIDAZOLE 500 MG PO TABS: 500.0000 mg | ORAL_TABLET | Freq: Two times a day (BID) | ORAL | 0 refills | Status: AC
